# Patient Record
Sex: Female | Born: 1955 | Race: White | Hispanic: No | Marital: Single | Smoking: Current every day smoker
Health system: Southern US, Community
[De-identification: ages and names within clinical notes are randomized; demographics above are authoritative.]

## PROBLEM LIST (undated history)

## (undated) DIAGNOSIS — M1990 Unspecified osteoarthritis, unspecified site: Secondary | ICD-10-CM

## (undated) HISTORY — DX: Unspecified osteoarthritis, unspecified site: M19.90

---

## 2017-06-08 ENCOUNTER — Encounter: Payer: Self-pay | Admitting: Physical Medicine & Rehabilitation

## 2017-07-12 ENCOUNTER — Encounter: Payer: Self-pay | Admitting: Physical Medicine & Rehabilitation

## 2017-07-12 ENCOUNTER — Encounter: Payer: Medicare Other | Attending: Physical Medicine & Rehabilitation | Admitting: Physical Medicine & Rehabilitation

## 2017-07-12 VITALS — BP 107/62 | HR 100

## 2017-07-12 DIAGNOSIS — F17210 Nicotine dependence, cigarettes, uncomplicated: Secondary | ICD-10-CM | POA: Insufficient documentation

## 2017-07-12 DIAGNOSIS — R42 Dizziness and giddiness: Secondary | ICD-10-CM | POA: Diagnosis not present

## 2017-07-12 DIAGNOSIS — G479 Sleep disorder, unspecified: Secondary | ICD-10-CM

## 2017-07-12 DIAGNOSIS — F319 Bipolar disorder, unspecified: Secondary | ICD-10-CM | POA: Diagnosis not present

## 2017-07-12 DIAGNOSIS — M159 Polyosteoarthritis, unspecified: Secondary | ICD-10-CM | POA: Diagnosis not present

## 2017-07-12 DIAGNOSIS — M545 Low back pain: Secondary | ICD-10-CM | POA: Insufficient documentation

## 2017-07-12 DIAGNOSIS — M791 Myalgia, unspecified site: Secondary | ICD-10-CM

## 2017-07-12 DIAGNOSIS — G2581 Restless legs syndrome: Secondary | ICD-10-CM | POA: Diagnosis not present

## 2017-07-12 DIAGNOSIS — Z888 Allergy status to other drugs, medicaments and biological substances status: Secondary | ICD-10-CM | POA: Diagnosis not present

## 2017-07-12 DIAGNOSIS — G8929 Other chronic pain: Secondary | ICD-10-CM | POA: Diagnosis not present

## 2017-07-12 DIAGNOSIS — G894 Chronic pain syndrome: Secondary | ICD-10-CM

## 2017-07-12 MED ORDER — BACLOFEN 10 MG PO TABS: 10.0000 mg | ORAL_TABLET | Freq: Three times a day (TID) | ORAL | 1 refills | Status: DC

## 2017-07-12 MED ORDER — DULOXETINE HCL 30 MG PO CPEP: 30.0000 mg | ORAL_CAPSULE | Freq: Every day | ORAL | 1 refills | Status: DC

## 2017-07-12 NOTE — Progress Notes (Signed)
Subjective:    Patient ID: Monica Rosales, female    DOB: [birth year], 61 y.o.   MRN: [id number]  HPI 61 y/o female with pmh of RLS, bipolar disorder, generalized OA presents with low back pain. Started 11/2016.  Overall stable.  No inciting events.  Stretching, exercises help.  No identifiable exacerbators.  All qualities of pain. Radiates laterally and up to shoulders. Intermittent. Associated tingling.  Dry needling helps and message and chiropracter.  PT, meloxicam, gabapentin with no benefit.  Hydrocodone starting to wear off.  Denies falls. Pain limits ADLs.  She saw 2 spine doctors, who did not recommend surgery and 2 "hip doctors" who recommend surgery, but she does not want intervention at present.     Currently on disability.   Pain Inventory Average Pain 4 Pain Right Now 6 My pain is intermittent, constant, sharp and aching  In the last 24 hours, has pain interfered with the following? General activity 6 Relation with others 2 Enjoyment of life 5 What TIME of day is your pain at its worst? morning daytime and night Sleep (in general) Fair  Pain is worse with: walking, inactivity and standing Pain improves with: rest, heat/ice, therapy/exercise, medication and TENS Relief from Meds: 6  Mobility walk without assistance walk with assistance use a walker how many minutes can you walk? 15-20 ability to climb steps?  yes do you drive?  yes Do you have any goals in this area?  yes  Function disabled: date disabled 2016  Neuro/Psych dizziness  Prior Studies Any changes since last visit?  no  Physicians involved in your care Any changes since last visit?  yes Primary care Elder Negus Psychiatrist Lucrezia Starch @ [facility]   Family history: Maternal back pain.  Social History   Social History  . Marital status: Single    Spouse name: N/A  . Number of children: N/A  . Years of education: N/A   Social History Main Topics  . Smoking status: Current Every Day Smoker     Packs/day: 1.00    Types: Cigarettes  . Smokeless tobacco: Never Used  . Alcohol use 2.4 oz/week    4 Cans of beer per week  . Drug use: No  . Sexual activity: Not Asked   Other Topics Concern  . None   Social History Narrative  . None   History reviewed. No pertinent surgical history. Past Medical History:  Diagnosis Date  . Arthritis    BP 107/62   Pulse 100   SpO2 97%   Opioid Risk Score:   Fall Risk Score:  `1  Depression screen PHQ 2/9  Depression screen PHQ 2/9 07/12/2017  Decreased Interest 0  Down, Depressed, Hopeless 0  PHQ - 2 Score 0  Altered sleeping 3  Tired, decreased energy 1  Change in appetite 0  Feeling bad or failure about yourself  0  Trouble concentrating 0  Moving slowly or fidgety/restless 2  Suicidal thoughts 0  PHQ-9 Score 6  Difficult doing work/chores Very difficult    Review of Systems  Constitutional: Positive for diaphoresis.  HENT: Negative.   Eyes: Negative.   Respiratory: Negative.   Cardiovascular: Negative.   Gastrointestinal: Positive for constipation and diarrhea.  Endocrine: Negative.   Genitourinary: Negative.   Musculoskeletal: Positive for arthralgias and back pain.  Skin: Negative.   Allergic/Immunologic: Positive for environmental allergies.  Neurological: Positive for dizziness.  Hematological: Negative.   Psychiatric/Behavioral:       Bipolar /manic  All other systems  reviewed and are negative.     Objective:   Physical Exam  Gen: NAD. Vital signs reviewed HENT: Normocephalic, Atraumatic Eyes: EOMI. No discharge.  Cardio: RRR. No JVD. Pulm: B/l clear to auscultation.  Effort normal Abd: Soft, BS+ MSK:  Gait antalgic.   TTP lumboscaral PSPs b/l.    No edema.   +FABERs for hip pain Neuro: CN II-XII grossly intact.    Sensation intact to light touch in all LE dermatomes  Reflexes 2+ throughout  Strength  5/5 in all LE myotomes, except 4-/5 left hip flexors  SLR neg b/l Skin: Warm and Dry.  Intact    Assessment & Plan:  61 y/o female with pmh of RLS, bipolar disorder, generalized OA presents with low back pain.   1. Chronic mechanical low back pain  MRI requested, will review  Labs reviewed  Referral information reviewed  NCCSRS reviewed  PT, lidoderm, valium, Mobic, tramadol, accupuncture ineffective in past  Allergic to Gabapentin  Cont Heat  Cont TENS  Pt cannot apply Voltaren gel to back  Will order Cymbalta 30mg   Will consider Baclofen 10mg  TID  Not interested in seeing Psychology because she does not need someone to "yak with" despite encouragement  Will consider bracing  Will not prescribe narcotics for generalized OA and history of bipolar mania  2. Sleep disturbance  Will consider Elavil 10mg  in future  3. Myalgia   Will consider trigger point injections  4. RLS  Cont meds per PCP

## 2017-07-31 ENCOUNTER — Other Ambulatory Visit: Payer: Self-pay | Admitting: Physical Medicine & Rehabilitation

## 2017-07-31 NOTE — Telephone Encounter (Signed)
If this is a patient request, we can fill it.  If this is a pharmacy request, I will wait until next week when I see her to determine efficacy.  Thanks.

## 2017-07-31 NOTE — Telephone Encounter (Signed)
Recieved electronic medication refill request for baclofen, last note stated:  Will consider Baclofen 10mg  TID  Is it ok to refill this medication?  Please advise

## 2017-08-09 ENCOUNTER — Other Ambulatory Visit: Payer: Self-pay | Admitting: Physical Medicine & Rehabilitation

## 2017-08-09 ENCOUNTER — Encounter: Payer: Medicare Other | Attending: Physical Medicine & Rehabilitation | Admitting: Physical Medicine & Rehabilitation

## 2017-08-09 ENCOUNTER — Ambulatory Visit (HOSPITAL_COMMUNITY)
Admission: RE | Admit: 2017-08-09 | Discharge: 2017-08-09 | Disposition: A | Discharge status: Home / Self Care | Payer: Medicare Other | Source: Ambulatory Visit | Attending: Physical Medicine & Rehabilitation | Admitting: Physical Medicine & Rehabilitation

## 2017-08-09 ENCOUNTER — Encounter: Payer: Self-pay | Admitting: Physical Medicine & Rehabilitation

## 2017-08-09 VITALS — BP 131/81 | HR 102

## 2017-08-09 DIAGNOSIS — M159 Polyosteoarthritis, unspecified: Secondary | ICD-10-CM | POA: Insufficient documentation

## 2017-08-09 DIAGNOSIS — G2581 Restless legs syndrome: Secondary | ICD-10-CM | POA: Insufficient documentation

## 2017-08-09 DIAGNOSIS — M25562 Pain in left knee: Secondary | ICD-10-CM | POA: Insufficient documentation

## 2017-08-09 DIAGNOSIS — Z888 Allergy status to other drugs, medicaments and biological substances status: Secondary | ICD-10-CM | POA: Diagnosis not present

## 2017-08-09 DIAGNOSIS — G894 Chronic pain syndrome: Secondary | ICD-10-CM

## 2017-08-09 DIAGNOSIS — G8929 Other chronic pain: Secondary | ICD-10-CM | POA: Diagnosis not present

## 2017-08-09 DIAGNOSIS — M791 Myalgia, unspecified site: Secondary | ICD-10-CM

## 2017-08-09 DIAGNOSIS — M25551 Pain in right hip: Secondary | ICD-10-CM

## 2017-08-09 DIAGNOSIS — M546 Pain in thoracic spine: Secondary | ICD-10-CM | POA: Diagnosis not present

## 2017-08-09 DIAGNOSIS — M545 Low back pain: Secondary | ICD-10-CM | POA: Diagnosis present

## 2017-08-09 DIAGNOSIS — M1711 Unilateral primary osteoarthritis, right knee: Secondary | ICD-10-CM | POA: Diagnosis not present

## 2017-08-09 DIAGNOSIS — F17210 Nicotine dependence, cigarettes, uncomplicated: Secondary | ICD-10-CM | POA: Insufficient documentation

## 2017-08-09 DIAGNOSIS — G479 Sleep disorder, unspecified: Secondary | ICD-10-CM | POA: Insufficient documentation

## 2017-08-09 DIAGNOSIS — F319 Bipolar disorder, unspecified: Secondary | ICD-10-CM | POA: Diagnosis not present

## 2017-08-09 DIAGNOSIS — M25561 Pain in right knee: Secondary | ICD-10-CM | POA: Diagnosis not present

## 2017-08-09 DIAGNOSIS — M25552 Pain in left hip: Secondary | ICD-10-CM

## 2017-08-09 DIAGNOSIS — R42 Dizziness and giddiness: Secondary | ICD-10-CM | POA: Insufficient documentation

## 2017-08-09 MED ORDER — BACLOFEN 10 MG PO TABS: 10.0000 mg | ORAL_TABLET | Freq: Three times a day (TID) | ORAL | 1 refills | Status: DC

## 2017-08-09 NOTE — Progress Notes (Deleted)
Subjective:    Patient ID: Monica Rosales, female    DOB: 1956/06/28, 61 y.o.   MRN: [id number]  Back Pain  Associated symptoms include numbness.  Hip Pain   Associated symptoms include numbness.  Knee Pain   Associated symptoms include numbness.   61 y/o female with pmh of RLS, bipolar disorder, generalized OA presents with low back pain.  Initially stated: Started 11/2016.  Overall stable.  No inciting events.  Stretching, exercises help.  No identifiable exacerbators.  All qualities of pain. Radiates laterally and up to shoulders. Intermittent. Associated tingling.  Dry needling helps and message and chiropracter.  PT, meloxicam, gabapentin with no benefit.  Hydrocodone starting to wear off.  Denies falls. Pain limits ADLs.  She saw 2 spine doctors, who did not recommend surgery and 2 "hip doctors" who recommend surgery, but she does not want intervention at present.     Currently on disability.   Pain Inventory Average Pain 4 Pain Right Now 4 My pain is tingling and aching  In the last 24 hours, has pain interfered with the following? General activity 5 Relation with others 3 Enjoyment of life 3 What TIME of day is your pain at its worst? morning &  daytime Sleep (in general) Fair  Pain is worse with: walking, bending, inactivity, standing and some activites Pain improves with: heat/ice, medication and TENS Relief from Meds: 6  Mobility walk without assistance walk with assistance use a walker how many minutes can you walk? 20 ability to climb steps?  yes do you drive?  yes transfers alone Do you have any goals in this area?  yes  Function disabled: date disabled 2016  Neuro/Psych numbness tremor tingling trouble walking spasms dizziness  Prior Studies Any changes since last visit?  no  Physicians involved in your care Any changes since last visit?  yes Primary care Elder Negus Psychiatrist Lucrezia Starch @ [facility]   Family history: Maternal back pain.    Social History   Social History  . Marital status: Single    Spouse name: N/A  . Number of children: N/A  . Years of education: N/A   Social History Main Topics  . Smoking status: Current Every Day Smoker    Packs/day: 1.00    Types: Cigarettes  . Smokeless tobacco: Never Used  . Alcohol use 2.4 oz/week    4 Cans of beer per week  . Drug use: No  . Sexual activity: Not Asked   Other Topics Concern  . None   Social History Narrative  . None   History reviewed. No pertinent surgical history. Past Medical History:  Diagnosis Date  . Arthritis    BP 131/81   Pulse (!) 102   SpO2 96%   Opioid Risk Score:   Fall Risk Score:  `1  Depression screen PHQ 2/9  Depression screen PHQ 2/9 07/12/2017  Decreased Interest 0  Down, Depressed, Hopeless 0  PHQ - 2 Score 0  Altered sleeping 3  Tired, decreased energy 1  Change in appetite 0  Feeling bad or failure about yourself  0  Trouble concentrating 0  Moving slowly or fidgety/restless 2  Suicidal thoughts 0  PHQ-9 Score 6  Difficult doing work/chores Very difficult    Review of Systems  Constitutional: Positive for diaphoresis.  HENT: Negative.   Eyes: Negative.   Respiratory: Negative.   Cardiovascular: Negative.   Gastrointestinal: Positive for constipation and diarrhea.  Endocrine: Negative.   Genitourinary: Negative.   Musculoskeletal: Positive  for arthralgias and back pain.  Skin: Negative.   Allergic/Immunologic: Positive for environmental allergies.  Neurological: Positive for dizziness, tremors and numbness.       Tingling Spasms  Hematological: Negative.   Psychiatric/Behavioral:       Bipolar /manic  All other systems reviewed and are negative.     Objective:   Physical Exam  Gen: NAD. Vital signs reviewed HENT: Normocephalic, Atraumatic Eyes: EOMI. No discharge.  Cardio: RRR. No JVD. Pulm: B/l clear to auscultation.  Effort normal Abd: Soft, BS+ MSK:  Gait antalgic.   TTP lumboscaral  PSPs b/l.    No edema.   +FABERs for hip pain Neuro: CN II-XII grossly intact.    Sensation intact to light touch in all LE dermatomes  Reflexes 2+ throughout  Strength  5/5 in all LE myotomes, except 4-/5 left hip flexors  SLR neg b/l Skin: Warm and Dry. Intact    Assessment & Plan:  61 y/o female with pmh of RLS, bipolar disorder, generalized OA presents with low back pain.   1. Chronic mechanical low back pain  MRI requested, will review  Labs reviewed  Referral information reviewed  NCCSRS reviewed  PT, lidoderm, valium, Mobic, tramadol, accupuncture ineffective in past  Allergic to Gabapentin  Cont Heat  Cont TENS  Pt cannot apply Voltaren gel to back  Will order Cymbalta 30mg   Will consider Baclofen 10mg  TID  Not interested in seeing Psychology because she does not need someone to "yak with" despite encouragement  Will consider bracing  Will not prescribe narcotics for generalized OA and history of bipolar mania  2. Sleep disturbance  Will consider Elavil 10mg  in future  3. Myalgia   Will consider trigger point injections  4. RLS  Cont meds per PCP

## 2017-08-09 NOTE — Progress Notes (Signed)
Subjective:    Patient ID: Monica Rosales, female    DOB: May 18, 1956, 61 y.o.   MRN: [id number]  HPI 61 y/o female with pmh of RLS, bipolar disorder, generalized OA presents for follow up for low back pain.  Initially stated: Started 11/2016.  Overall stable.  No inciting events.  Stretching, exercises help.  No identifiable exacerbators.  All qualities of pain. Radiates laterally and up to shoulders. Intermittent. Associated tingling.  Dry needling helps and message and chiropracter.  PT, meloxicam, gabapentin with no benefit.  Hydrocodone starting to wear off.  Denies falls. Pain limits ADLs.  She saw 2 spine doctors, who did not recommend surgery and 2 "hip doctors" who recommend surgery, but she does not want intervention at present.     Last clinic visit 07/12/17.  Since that visit, she did not try Cymbalta because she read the interactions and spoke with her Psychiatrist, who said not to take medication. Baclofen is working well.  Her RLS has improved.    Pain Inventory Average Pain 5 Pain Right Now 4 My pain is aching  In the last 24 hours, has pain interfered with the following? General activity 5 Relation with others 3 Enjoyment of life 2 What TIME of day is your pain at its worst? morning daytime  Sleep (in general) Fair  Pain is worse with: walking, bending, inactivity, standing and some activites Pain improves with: heat/ice, medication and TENS Relief from Meds: 5  Mobility walk without assistance walk with assistance use a walker how many minutes can you walk? 15-20 ability to climb steps?  yes do you drive?  yes Do you have any goals in this area?  yes  Function disabled: date disabled 2016 Do you have any goals in this area?  no  Neuro/Psych numbness tremor tingling trouble walking spasms dizziness  Prior Studies Any changes since last visit?  no  Physicians involved in your care Any changes since last visit?  yes Primary care Elder Negus Psychiatrist Lucrezia Starch @ [facility]   Family history: Maternal back pain.  Social History   Social History  . Marital status: Single    Spouse name: N/A  . Number of children: N/A  . Years of education: N/A   Social History Main Topics  . Smoking status: Current Every Day Smoker    Packs/day: 1.00    Types: Cigarettes  . Smokeless tobacco: Never Used  . Alcohol use 2.4 oz/week    4 Cans of beer per week  . Drug use: No  . Sexual activity: Not Asked   Other Topics Concern  . None   Social History Narrative  . None   History reviewed. No pertinent surgical history. Past Medical History:  Diagnosis Date  . Arthritis    BP 131/81   Pulse (!) 102   SpO2 96%   Opioid Risk Score:   Fall Risk Score:  `1  Depression screen PHQ 2/9  Depression screen PHQ 2/9 07/12/2017  Decreased Interest 0  Down, Depressed, Hopeless 0  PHQ - 2 Score 0  Altered sleeping 3  Tired, decreased energy 1  Change in appetite 0  Feeling bad or failure about yourself  0  Trouble concentrating 0  Moving slowly or fidgety/restless 2  Suicidal thoughts 0  PHQ-9 Score 6  Difficult doing work/chores Very difficult    Review of Systems  Constitutional: Positive for diaphoresis.  HENT: Negative.   Eyes: Negative.   Respiratory: Negative.   Cardiovascular: Negative.  Gastrointestinal: Positive for constipation and diarrhea.  Endocrine: Negative.   Genitourinary: Negative.   Musculoskeletal: Positive for arthralgias and back pain.  Skin: Negative.   Allergic/Immunologic: Positive for environmental allergies.  Neurological: Positive for dizziness.  Hematological: Negative.   Psychiatric/Behavioral:       Bipolar /manic  All other systems reviewed and are negative.     Objective:   Physical Exam  Gen: NAD. Vital signs reviewed HENT: Normocephalic, Atraumatic Eyes: EOMI. No discharge.  Cardio: RRR. No JVD. Pulm: B/l clear to auscultation.  Effort normal Abd: Soft, BS+ MSK:   Gait antalgic.   TTP thoracic and sacral PSPs b/l.   Neuro:   Strength  5/5 in all LE myotomes, except 4-/5 left hip flexors Skin: Warm and Dry. Intact    Assessment & Plan:  61 y/o female with pmh of RLS, bipolar disorder, generalized OA presents for follow up of low back pain.   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 Sleep disturbance  Will consider Elavil 10mg  in future  3. Myalgia   Will consider trigger point injections in future  4. RLS  Cont meds per PCP  Baclofen helping

## 2017-09-13 ENCOUNTER — Other Ambulatory Visit: Payer: Self-pay | Admitting: Physical Medicine & Rehabilitation

## 2017-10-10 ENCOUNTER — Encounter: Payer: Medicare Other | Attending: Physical Medicine & Rehabilitation | Admitting: Physical Medicine & Rehabilitation

## 2017-10-10 ENCOUNTER — Encounter: Payer: Self-pay | Admitting: Physical Medicine & Rehabilitation

## 2017-10-10 VITALS — BP 110/75 | HR 100 | Resp 14

## 2017-10-10 DIAGNOSIS — M25561 Pain in right knee: Secondary | ICD-10-CM

## 2017-10-10 DIAGNOSIS — M25551 Pain in right hip: Secondary | ICD-10-CM | POA: Diagnosis not present

## 2017-10-10 DIAGNOSIS — F319 Bipolar disorder, unspecified: Secondary | ICD-10-CM | POA: Diagnosis not present

## 2017-10-10 DIAGNOSIS — M159 Polyosteoarthritis, unspecified: Secondary | ICD-10-CM | POA: Insufficient documentation

## 2017-10-10 DIAGNOSIS — G8929 Other chronic pain: Secondary | ICD-10-CM

## 2017-10-10 DIAGNOSIS — M25562 Pain in left knee: Secondary | ICD-10-CM | POA: Diagnosis not present

## 2017-10-10 DIAGNOSIS — G479 Sleep disorder, unspecified: Secondary | ICD-10-CM

## 2017-10-10 DIAGNOSIS — M545 Low back pain, unspecified: Secondary | ICD-10-CM

## 2017-10-10 DIAGNOSIS — G2581 Restless legs syndrome: Secondary | ICD-10-CM

## 2017-10-10 DIAGNOSIS — R42 Dizziness and giddiness: Secondary | ICD-10-CM | POA: Diagnosis not present

## 2017-10-10 DIAGNOSIS — F17210 Nicotine dependence, cigarettes, uncomplicated: Secondary | ICD-10-CM | POA: Diagnosis not present

## 2017-10-10 DIAGNOSIS — M25552 Pain in left hip: Secondary | ICD-10-CM

## 2017-10-10 DIAGNOSIS — G894 Chronic pain syndrome: Secondary | ICD-10-CM | POA: Diagnosis not present

## 2017-10-10 DIAGNOSIS — Z888 Allergy status to other drugs, medicaments and biological substances status: Secondary | ICD-10-CM | POA: Diagnosis not present

## 2017-10-10 DIAGNOSIS — M546 Pain in thoracic spine: Secondary | ICD-10-CM

## 2017-10-10 DIAGNOSIS — M791 Myalgia, unspecified site: Secondary | ICD-10-CM

## 2017-10-10 MED ORDER — AMITRIPTYLINE HCL 10 MG PO TABS: 10.0000 mg | ORAL_TABLET | Freq: Every day | ORAL | 1 refills | Status: DC

## 2017-10-10 MED ORDER — BACLOFEN 20 MG PO TABS: 20.0000 mg | ORAL_TABLET | Freq: Three times a day (TID) | ORAL | 0 refills | Status: DC

## 2017-10-10 NOTE — Progress Notes (Signed)
Subjective:    Patient ID: Monica Rosales, female    DOB: 1956-11-13, 61 y.o.   MRN: [id number]  HPI 61 y/o female with pmh of RLS, bipolar disorder, generalized OA presents for follow up for low back pain.  Initially stated: Started 11/2016.  Overall stable.  No inciting events.  Stretching, exercises help.  No identifiable exacerbators.  All qualities of pain. Radiates laterally and up to shoulders. Intermittent. Associated tingling.  Dry needling helps and message and chiropracter.  PT, meloxicam, gabapentin with no benefit.  Hydrocodone starting to wear off.  Denies falls. Pain limits ADLs.  She saw 2 spine doctors, who did not recommend surgery and 2 "hip doctors" who recommend surgery, but she does not want intervention at present.     Last clinic visit 08/09/17.  Since last visit, she states the Baclofen is no longer working.  She continues to go with chiropractor and massage. She did obtain xrays of her hips and knees. Sleep has not improved.    Pain Inventory Average Pain 3 Pain Right Now 7 My pain is constant, tingling and aching  In the last 24 hours, has pain interfered with the following? General activity 7 Relation with others 3 Enjoyment of life 6 What TIME of day is your pain at its worst? morning night Sleep (in general) Poor  Pain is worse with: walking, sitting, inactivity, standing and some activites Pain improves with: therapy/exercise Relief from Meds: 1  Mobility walk without assistance walk with assistance use a walker how many minutes can you walk? 15-20 ability to climb steps?  no do you drive?  yes use a wheelchair Do you have any goals in this area?  yes  Function disabled: date disabled 2016 Do you have any goals in this area?  yes  Neuro/Psych numbness tremor tingling trouble walking spasms dizziness  Prior Studies Any changes since last visit?  no  Physicians involved in your care Any changes since last visit?  yes Primary care Elder Negus Psychiatrist Lucrezia Starch @ [facility]   Family history: Maternal back pain.  Social History   Social History  . Marital status: Single    Spouse name: N/A  . Number of children: N/A  . Years of education: N/A   Social History Main Topics  . Smoking status: Current Every Day Smoker    Packs/day: 1.00    Types: Cigarettes  . Smokeless tobacco: Never Used  . Alcohol use 2.4 oz/week    4 Cans of beer per week  . Drug use: No  . Sexual activity: Not Asked   Other Topics Concern  . None   Social History Narrative  . None   History reviewed. No pertinent surgical history. Past Medical History:  Diagnosis Date  . Arthritis    BP 110/75 (BP Location: Left Arm, Patient Position: Sitting, Cuff Size: Normal)   Pulse 100   Resp 14   SpO2 96%   Opioid Risk Score:   Fall Risk Score:  `1  Depression screen PHQ 2/9  Depression screen PHQ 2/9 07/12/2017  Decreased Interest 0  Down, Depressed, Hopeless 0  PHQ - 2 Score 0  Altered sleeping 3  Tired, decreased energy 1  Change in appetite 0  Feeling bad or failure about yourself  0  Trouble concentrating 0  Moving slowly or fidgety/restless 2  Suicidal thoughts 0  PHQ-9 Score 6  Difficult doing work/chores Very difficult    Review of Systems  Constitutional: Positive for diaphoresis and unexpected  weight change.  HENT: Negative.   Eyes: Negative.   Respiratory: Negative.   Cardiovascular: Negative.   Gastrointestinal: Positive for constipation.  Endocrine: Negative.   Genitourinary: Negative.   Musculoskeletal: Positive for arthralgias, back pain, gait problem and myalgias.       Spasms   Skin: Negative.   Allergic/Immunologic: Positive for environmental allergies.  Neurological: Positive for dizziness and tremors.       Tingling   Hematological: Negative.   Psychiatric/Behavioral:       Bipolar /manic  All other systems reviewed and are negative.     Objective:   Physical Exam  Gen: NAD. Vital signs  reviewed HENT: Normocephalic, Atraumatic Eyes: EOMI. No discharge.  Cardio: RRR. No JVD. Pulm: B/l clear to auscultation.  Effort normal Abd: Soft, BS+ MSK:  Gait antalgic.   +Mild TTP gluteal muscles and b/l greater trochanteric area Neuro:   Strength  5/5 in all LE myotomes, except 4-/5 left hip flexors Skin: Warm and Dry. Intact Psych: Agitated    Assessment & Plan:  61 y/o female with pmh of RLS, bipolar disorder, generalized OA presents for follow up of low back pain.   1. 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  PT, lidoderm, valium, Mobic, tramadol, accupuncture ineffective in past  Allergic to Gabapentin, does not want to try Lyrica either  Pt states she was told not to take Cymbalta by Psychiatry  Cont Heat  Cont chiropractor, massage, not interested in pool therapy, states she is excercises  Cont TENS  Pt cannot apply Voltaren gel to back  Will increase Baclofen 10mg  TID to 20 TID  Will not prescribe narcotics for generalized OA and history of bipolar mania  Not interested in seeing Psychology because she does not need someone to "yak with" despite encouragement  Significant improvement in back  2. Sleep disturbance  Will order Elavil 10mg , patient will speak to her Psychiatrist  3. Myalgia   Will consider trigger point injections in future  4. RLS  Cont meds per PCP  5. Multiple joint pain  Now main issue  Xrays knees/hips reviewed showing degenerative changes  Pt does not want to take diclofenac 50 TID with food at present, would like to limit medications  Will consider Rheum referral, however, pt does not wish to pursue at this time  Would like to hold off on injections at this time

## 2017-10-12 ENCOUNTER — Telehealth: Payer: Self-pay

## 2017-10-12 ENCOUNTER — Telehealth: Payer: Self-pay | Admitting: Physical Medicine & Rehabilitation

## 2017-10-12 MED ORDER — BACLOFEN 20 MG PO TABS: 20.0000 mg | ORAL_TABLET | Freq: Three times a day (TID) | ORAL | 1 refills | Status: DC

## 2017-10-12 NOTE — Telephone Encounter (Signed)
There must have been some default change.  She is to take 20mg  (1 tab) three times a day, so she should have 90 tabs.  Thanks.

## 2017-10-12 NOTE — Addendum Note (Signed)
Addended by: Barbee ShropshireBRIGHT, BRUCE B on: 10/12/2017 01:44 PM   Modules accepted: Orders

## 2017-10-12 NOTE — Telephone Encounter (Signed)
Patient has called stating she was dispensed 30 pills of baclofen she would like to know if she was only doing  a ten day trial?  Please advise and give patient a call.

## 2017-10-12 NOTE — Telephone Encounter (Signed)
Medication reordered with correct amount of meds sent to family pharmacy by way of escribe

## 2017-10-12 NOTE — Telephone Encounter (Signed)
RECD VOICEMAIL THAT PATIENTS MED WAS CALLED IN INCORRECTLY - CONFIRMED FROM MESSAGE TO AP THAT LOOKS LIKE A TRANSPOSED CONVERSION WHEN HE CHANGED MILLIGRAMS - I CALLED TO ADVISE HER FIRST PHONE CALL HAD NO PHONE NUMBER OR DOB ON 10/10/17 AT 4:18 PM - WE COULD NOT IDENTIFY WHICH [name] WE NEEDED TO RETURN CALL TO -   SHE REQUESTS I FIND OUT WHY PHARMACY FAX ALSO DID NOT COME INTO CLINIC - I TOLD HER I DO NOT SEE A NOTATION OF A FAX BEING RECEIVED AT THIS POINT - BUT I WOULD LOOK INTO THAT ALSO  .ADVISED HER I DISCUSSED FAILURE WITH DR PATEL - SHE STATES THERE WAS A THIRD PHONE CALL (WE HAVE NO RECORD) AND THIS WAS THE SECOND VISIT SHE HAS HAD THAT MED NUMBERS WERE INCORRECT.   I APOLOGIZED AND ASKED HER TO HAVE A NICE DAY - PATIENT IS NOT HAPPY AND DID NOT ACCEPT APOLOGY.   I FOUND NO FAXES REGARDING MED REQ

## 2017-10-12 NOTE — Telephone Encounter (Signed)
Patient called and states you gave her baclofen that is to last her until 12.19.18 and the RX only covers 10 days?  You increased miligram because the notes states she told you it's not working - did you mean this as a trial - or did you end date it too soon.  If so please place order to give her full  RX.  I need to call her back and explain what has happened please.

## 2017-12-05 ENCOUNTER — Encounter: Payer: Medicare Other | Admitting: Physical Medicine & Rehabilitation

## 2017-12-12 ENCOUNTER — Ambulatory Visit: Payer: Medicare Other | Admitting: Physical Medicine & Rehabilitation

## 2017-12-13 ENCOUNTER — Ambulatory Visit: Payer: Medicare Other | Admitting: Physical Medicine & Rehabilitation

## 2017-12-13 ENCOUNTER — Encounter: Payer: Self-pay | Admitting: Physical Medicine & Rehabilitation

## 2017-12-13 ENCOUNTER — Encounter: Payer: Medicare Other | Attending: Physical Medicine & Rehabilitation | Admitting: Physical Medicine & Rehabilitation

## 2017-12-13 VITALS — BP 117/80 | HR 104

## 2017-12-13 DIAGNOSIS — M546 Pain in thoracic spine: Secondary | ICD-10-CM

## 2017-12-13 DIAGNOSIS — F319 Bipolar disorder, unspecified: Secondary | ICD-10-CM | POA: Diagnosis not present

## 2017-12-13 DIAGNOSIS — M545 Low back pain: Secondary | ICD-10-CM | POA: Insufficient documentation

## 2017-12-13 DIAGNOSIS — G8929 Other chronic pain: Secondary | ICD-10-CM | POA: Insufficient documentation

## 2017-12-13 DIAGNOSIS — M25551 Pain in right hip: Secondary | ICD-10-CM

## 2017-12-13 DIAGNOSIS — M25552 Pain in left hip: Secondary | ICD-10-CM | POA: Diagnosis not present

## 2017-12-13 DIAGNOSIS — M791 Myalgia, unspecified site: Secondary | ICD-10-CM

## 2017-12-13 DIAGNOSIS — R42 Dizziness and giddiness: Secondary | ICD-10-CM | POA: Insufficient documentation

## 2017-12-13 DIAGNOSIS — Z888 Allergy status to other drugs, medicaments and biological substances status: Secondary | ICD-10-CM | POA: Diagnosis not present

## 2017-12-13 DIAGNOSIS — M25562 Pain in left knee: Secondary | ICD-10-CM | POA: Diagnosis not present

## 2017-12-13 DIAGNOSIS — M25561 Pain in right knee: Secondary | ICD-10-CM

## 2017-12-13 DIAGNOSIS — G479 Sleep disorder, unspecified: Secondary | ICD-10-CM

## 2017-12-13 DIAGNOSIS — G2581 Restless legs syndrome: Secondary | ICD-10-CM | POA: Insufficient documentation

## 2017-12-13 DIAGNOSIS — G894 Chronic pain syndrome: Secondary | ICD-10-CM | POA: Diagnosis not present

## 2017-12-13 DIAGNOSIS — M159 Polyosteoarthritis, unspecified: Secondary | ICD-10-CM | POA: Insufficient documentation

## 2017-12-13 DIAGNOSIS — F17210 Nicotine dependence, cigarettes, uncomplicated: Secondary | ICD-10-CM | POA: Insufficient documentation

## 2017-12-13 MED ORDER — DICLOFENAC POTASSIUM 50 MG PO TABS: 50.0000 mg | ORAL_TABLET | Freq: Three times a day (TID) | ORAL | 2 refills | Status: AC

## 2017-12-13 NOTE — Progress Notes (Signed)
Subjective:    Patient ID: Monica Rosales, female    DOB: Oct 21, 1956, 61 y.o.   MRN: [id number]  HPI 61 y/o female with pmh of RLS, bipolar disorder, generalized OA presents for follow up for low back pain.  Initially stated: Started 11/2016.  Overall stable.  No inciting events.  Stretching, exercises help.  No identifiable exacerbators.  All qualities of pain. Radiates laterally and up to shoulders. Intermittent. Associated tingling.  Dry needling helps and message and chiropracter.  PT, meloxicam, gabapentin with no benefit.  Hydrocodone starting to wear off.  Denies falls. Pain limits ADLs.  She saw 2 spine doctors, who did not recommend surgery and 2 "hip doctors" who recommend surgery, but she does not want intervention at present.     Last clinic visit 10/10/17.  Since last visit, pt states she would like to go back to Hydrocodone.  She states she had reflux with Baclofen.  She was prescribed Tizanidine by Neurology.  She states she tried Elavil and became depressed.   Pain Inventory Average Pain 4 Pain Right Now 4 My pain is intermittent, sharp, tingling and aching  In the last 24 hours, has pain interfered with the following? General activity 6 Relation with others 3 Enjoyment of life 4 What TIME of day is your pain at its worst? morning night Sleep (in general) Fair  Pain is worse with: walking, sitting, inactivity, standing and some activites Pain improves with: therapy/exercise Relief from Meds: 1  Mobility walk without assistance walk with assistance use a walker how many minutes can you walk? 15-20 ability to climb steps?  no do you drive?  yes use a wheelchair Do you have any goals in this area?  yes  Function disabled: date disabled 2016 Do you have any goals in this area?  yes  Neuro/Psych numbness tremor tingling trouble walking spasms dizziness  Prior Studies Any changes since last visit?  no  Physicians involved in your care Any changes since last  visit?  yes Primary care Elder Negusavid Sanders Psychiatrist Lucrezia Starchana Stiles @ [facility]   Family history: Maternal back pain.  Social History   Socioeconomic History  . Marital status: Single    Spouse name: None  . Number of children: None  . Years of education: None  . Highest education level: None  Social Needs  . Financial resource strain: None  . Food insecurity - worry: None  . Food insecurity - inability: None  . Transportation needs - medical: None  . Transportation needs - non-medical: None  Occupational History  . None  Tobacco Use  . Smoking status: Current Every Day Smoker    Packs/day: 1.00    Types: Cigarettes  . Smokeless tobacco: Never Used  Substance and Sexual Activity  . Alcohol use: Yes    Alcohol/week: 2.4 oz    Types: 4 Cans of beer per week  . Drug use: No  . Sexual activity: None  Other Topics Concern  . None  Social History Narrative  . None   No past surgical history on file. Past Medical History:  Diagnosis Date  . Arthritis    BP 117/80   Pulse (!) 104   SpO2 98%   Opioid Risk Score:   Fall Risk Score:  `1  Depression screen PHQ 2/9  Depression screen PHQ 2/9 07/12/2017  Decreased Interest 0  Down, Depressed, Hopeless 0  PHQ - 2 Score 0  Altered sleeping 3  Tired, decreased energy 1  Change in appetite 0  Feeling bad or failure about yourself  0  Trouble concentrating 0  Moving slowly or fidgety/restless 2  Suicidal thoughts 0  PHQ-9 Score 6  Difficult doing work/chores Very difficult    Review of Systems  Constitutional: Positive for diaphoresis.  HENT: Negative.   Eyes: Negative.   Respiratory: Negative.   Cardiovascular: Negative.   Endocrine: Negative.   Genitourinary: Negative.   Musculoskeletal: Positive for arthralgias, back pain, gait problem and myalgias.       Spasms   Skin: Negative.   Allergic/Immunologic: Negative.   Neurological:       Tingling   Hematological: Negative.   Psychiatric/Behavioral:        Bipolar /manic  All other systems reviewed and are negative.     Objective:   Physical Exam  Gen: NAD. Vital signs reviewed HENT: Normocephalic, Atraumatic Eyes: EOMI. No discharge.  Cardio: RRR. No JVD. Pulm: B/l clear to auscultation.  Effort normal Abd: Soft, BS+ MSK:  Gait mildly antalgic.   +TTP b/l lumbosacral PSPs and b/l greater trochanteric area Neuro:   Strength  5/5 in all LE myotomes, except 4-/5 left hip flexors Skin: Warm and Dry. Intact Psych: Agitated.     Assessment & Plan:  61 y/o female with pmh of RLS, bipolar disorder, generalized OA presents for follow up of low back pain.   1. 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  PT, lidoderm, valium, Mobic, Naproxen, tramadol, accupuncture, baclofen, tizanidine, valium ineffective in past  Allergic to Gabapentin, does not want to try Lyrica either  Pt states she was told not to take Cymbalta by Psychiatry  Cont Heat  Cont chiropractor, massage, not interested in pool therapy, states she is excercises  Cont TENS  Pt cannot apply Voltaren gel to back  Will not prescribe narcotics for generalized OA and history of bipolar mania  Not interested in seeing Psychology because she does not need someone to "yak with" despite encouragement, says she speaks with Psychiatrist  Good benefit with dry needling   2. Sleep disturbance  Unable to tolerate Elavil   3. Myalgia   Will consider trigger point injections in future  4. RLS  Cont meds per PCP  5. Multiple joint pain  Now main issue  Xrays knees/hips reviewed showing degenerative changes  Will consider Rheum referral, however, pt does not wish to pursue at this time  Xrays reviewed with patient per pt request  Would like to continue to hold off on injections at this time  Will order Diclofenac, pt prefers PRN dose  6. Muscle pain  Pt would like to hold off on Robaxin at present

## 2018-02-05 IMAGING — CR DG HIP (WITH OR WITHOUT PELVIS) 3-4V BILAT
3 series · 3 of 3 positions shown · non-contrast
Comparison: None.

CLINICAL DATA: Chronic bilateral hip pain, no known injury, initial
encounter

EXAM:
DG HIP (WITH OR WITHOUT PELVIS) 3-4V BILAT

[pelvis ap]
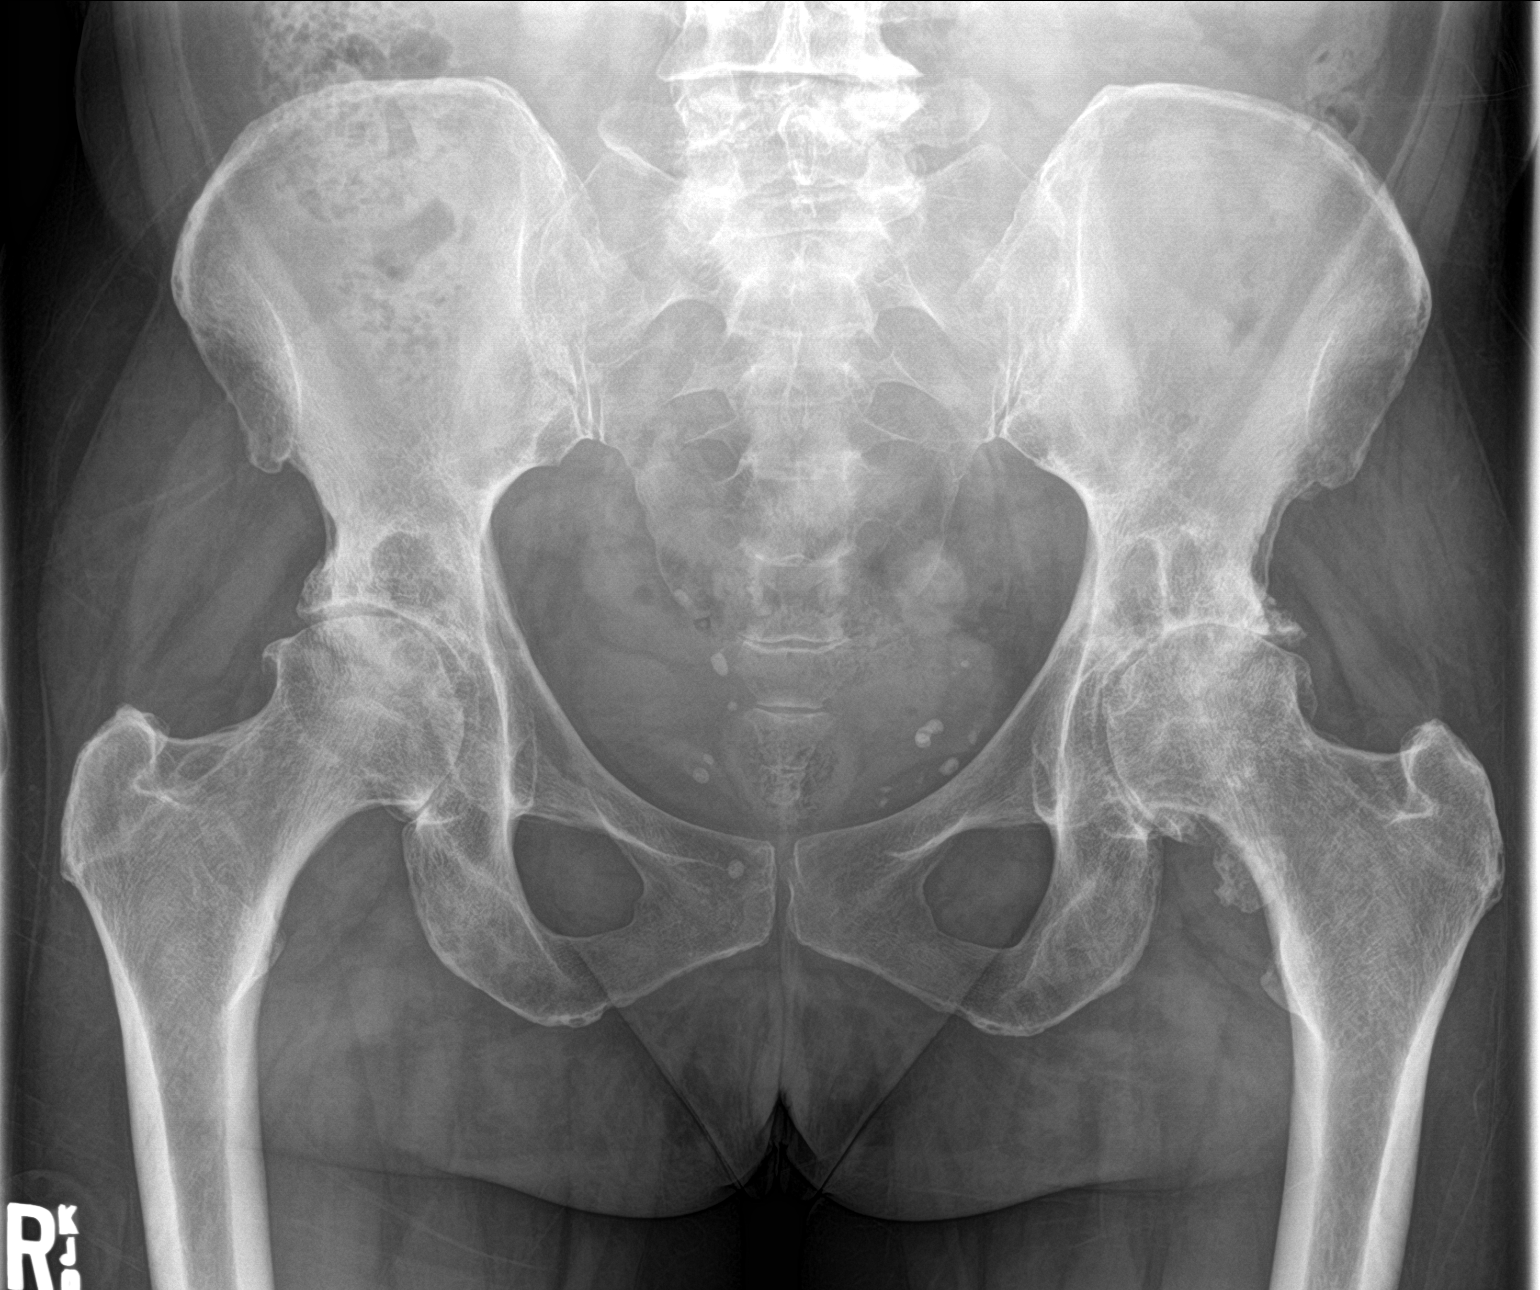

[hip lat (1 of 2)]
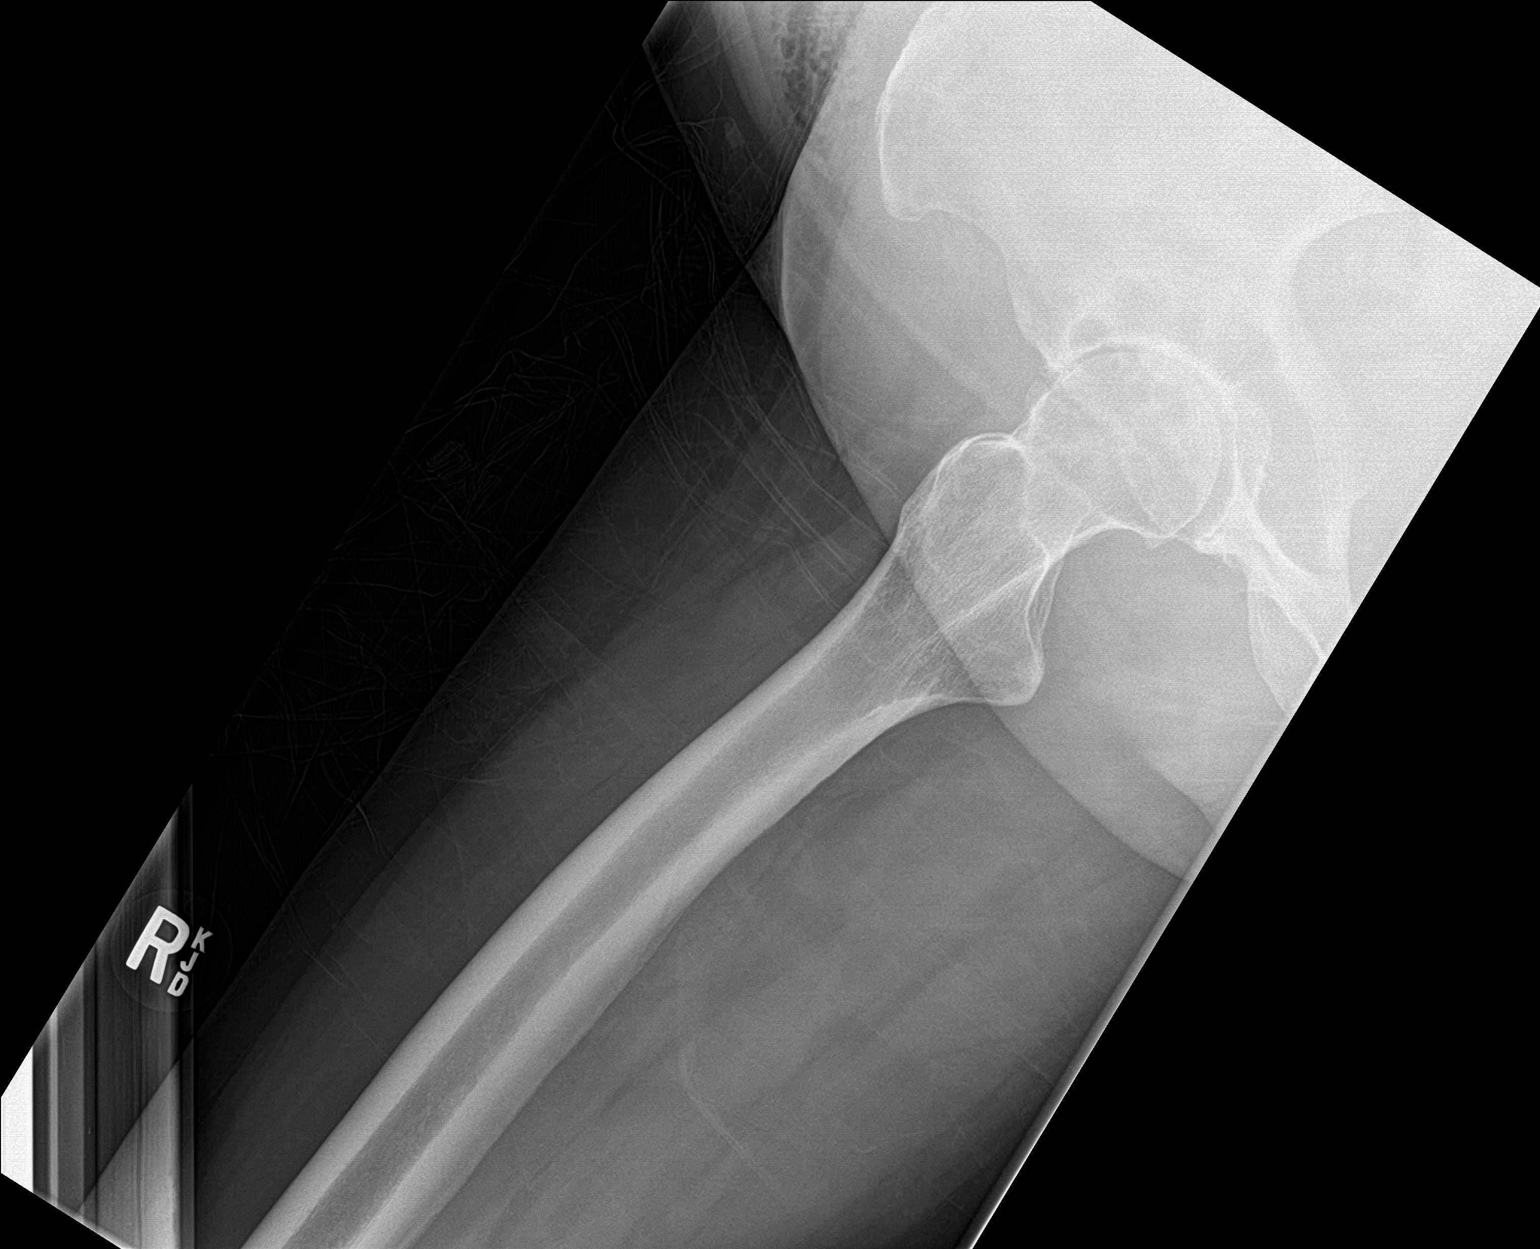

[hip lat (2 of 2)]
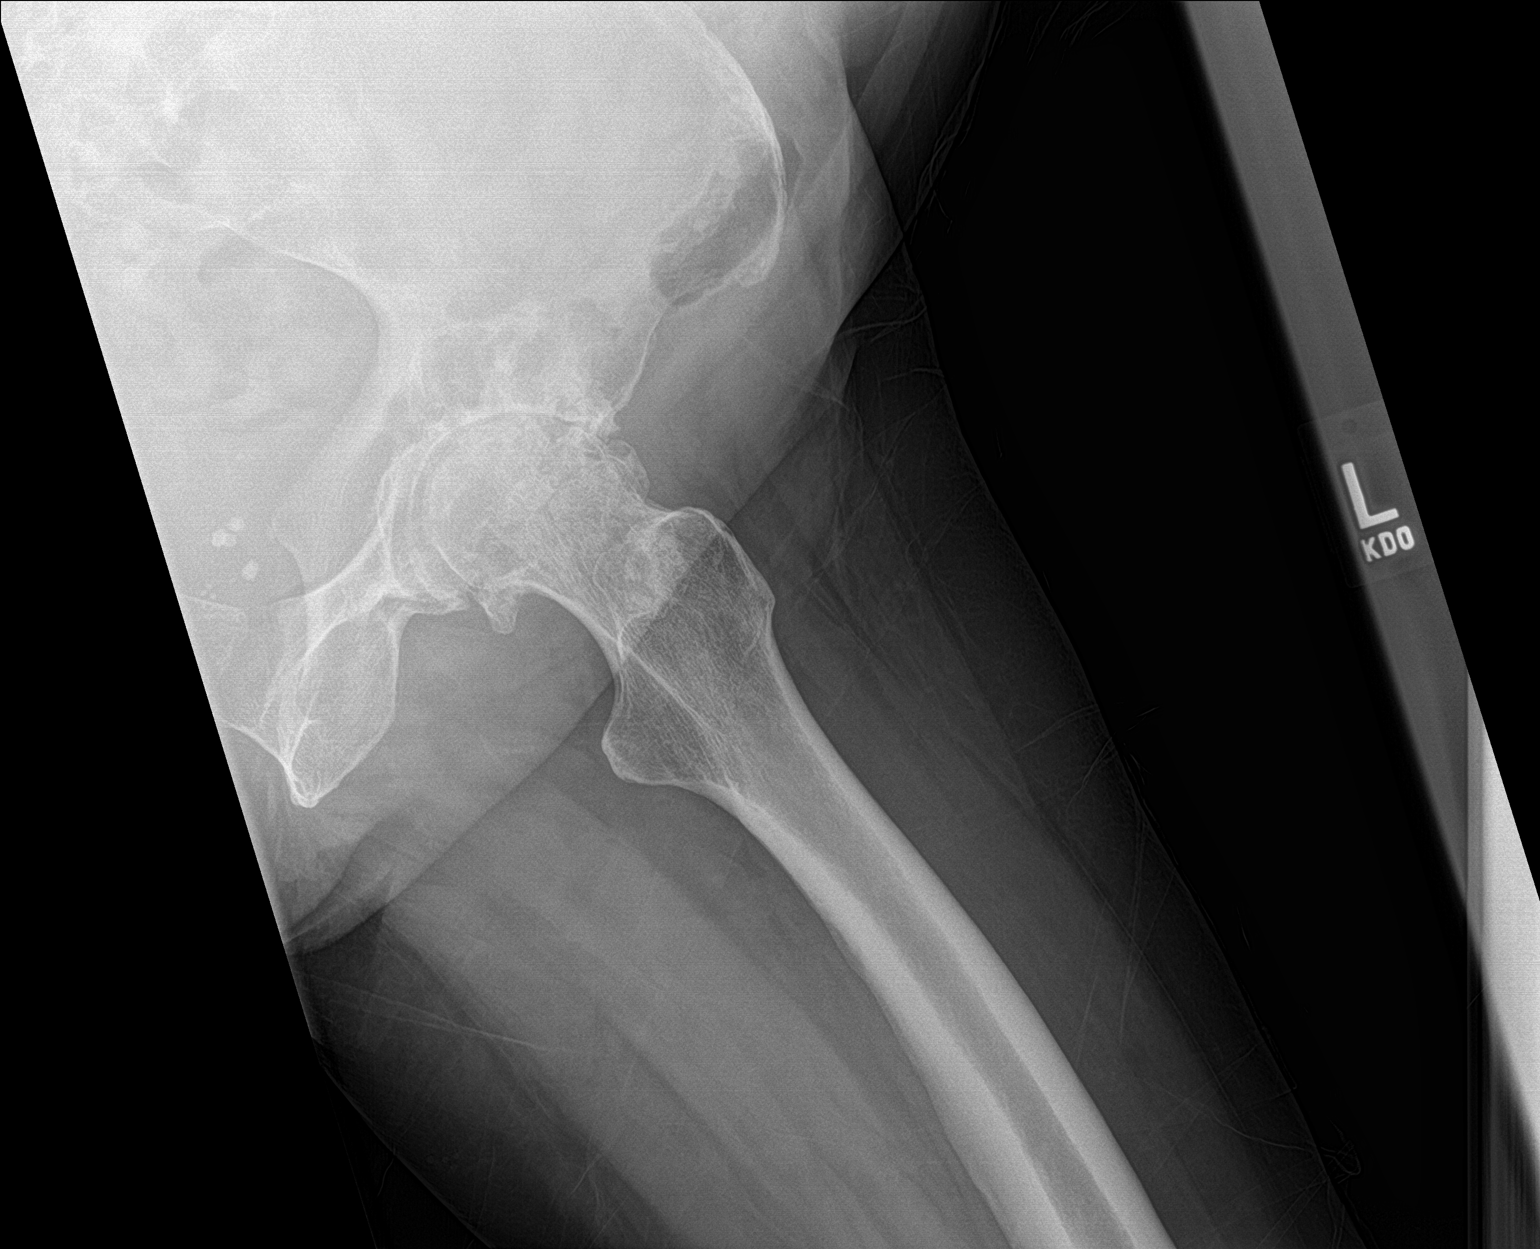

[3 of 3 positions shown; findings below may reference images not displayed]

FINDINGS: The pelvic ring is intact. Significant degenerative changes of the
hip joints are noted worse on the left than the right. No acute
fracture or dislocation is seen. Subchondral cyst formation is noted
within the superior acetabulum bilaterally. No soft tissue
abnormality is noted
IMPRESSION: Degenerative changes as described.  No acute abnormality is noted.

## 2018-02-14 ENCOUNTER — Ambulatory Visit: Payer: Medicare Other | Admitting: Physical Medicine & Rehabilitation

## 2020-04-13 NOTE — Telephone Encounter (Signed)
Cl,ose encounter

## 2022-10-10 ENCOUNTER — Ambulatory Visit (HOSPITAL_COMMUNITY): Payer: Medicare Other | Admitting: Psychiatry
